# Patient Record
Sex: Male | Born: 1990 | Race: Black or African American | Hispanic: No | Marital: Single | Smoking: Current every day smoker
Health system: Southern US, Community
[De-identification: ages and names within clinical notes are randomized; demographics above are authoritative.]

---
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
## 2018-09-29 ENCOUNTER — Encounter (HOSPITAL_COMMUNITY): Payer: Self-pay

## 2018-09-29 ENCOUNTER — Emergency Department (HOSPITAL_COMMUNITY): Payer: No Typology Code available for payment source

## 2018-09-29 ENCOUNTER — Emergency Department (HOSPITAL_COMMUNITY)
Admission: EM | Admit: 2018-09-29 | Discharge: 2018-09-29 | Disposition: A | Discharge status: Home / Self Care | Payer: No Typology Code available for payment source | Attending: Emergency Medicine | Admitting: Emergency Medicine

## 2018-09-29 ENCOUNTER — Other Ambulatory Visit: Payer: Self-pay

## 2018-09-29 DIAGNOSIS — F17210 Nicotine dependence, cigarettes, uncomplicated: Secondary | ICD-10-CM | POA: Diagnosis not present

## 2018-09-29 DIAGNOSIS — Y92410 Unspecified street and highway as the place of occurrence of the external cause: Secondary | ICD-10-CM | POA: Insufficient documentation

## 2018-09-29 DIAGNOSIS — W2210XA Striking against or struck by unspecified automobile airbag, initial encounter: Secondary | ICD-10-CM | POA: Diagnosis not present

## 2018-09-29 DIAGNOSIS — Y9389 Activity, other specified: Secondary | ICD-10-CM | POA: Insufficient documentation

## 2018-09-29 DIAGNOSIS — Y998 Other external cause status: Secondary | ICD-10-CM | POA: Diagnosis not present

## 2018-09-29 DIAGNOSIS — F1210 Cannabis abuse, uncomplicated: Secondary | ICD-10-CM | POA: Insufficient documentation

## 2018-09-29 DIAGNOSIS — S22008A Other fracture of unspecified thoracic vertebra, initial encounter for closed fracture: Secondary | ICD-10-CM

## 2018-09-29 DIAGNOSIS — Z23 Encounter for immunization: Secondary | ICD-10-CM | POA: Diagnosis not present

## 2018-09-29 DIAGNOSIS — S299XXA Unspecified injury of thorax, initial encounter: Secondary | ICD-10-CM | POA: Diagnosis present

## 2018-09-29 LAB — URINALYSIS, ROUTINE W REFLEX MICROSCOPIC
Bilirubin Urine: NEGATIVE
Glucose, UA: NEGATIVE mg/dL
Hgb urine dipstick: NEGATIVE
Ketones, ur: NEGATIVE mg/dL
Leukocytes,Ua: NEGATIVE
Nitrite: NEGATIVE
Protein, ur: NEGATIVE mg/dL
Specific Gravity, Urine: 1.004 — ABNORMAL LOW (ref 1.005–1.030)
pH: 6 (ref 5.0–8.0)

## 2018-09-29 LAB — COMPREHENSIVE METABOLIC PANEL
ALT: 153 U/L — ABNORMAL HIGH (ref 0–44)
AST: 81 U/L — ABNORMAL HIGH (ref 15–41)
Albumin: 4.7 g/dL (ref 3.5–5.0)
Alkaline Phosphatase: 51 U/L (ref 38–126)
Anion gap: 12 (ref 5–15)
BUN: 8 mg/dL (ref 6–20)
CO2: 22 mmol/L (ref 22–32)
Calcium: 9.3 mg/dL (ref 8.9–10.3)
Chloride: 106 mmol/L (ref 98–111)
Creatinine, Ser: 0.88 mg/dL (ref 0.61–1.24)
GFR calc Af Amer: >60 mL/min (ref >60)
GFR calc non Af Amer: >60 mL/min (ref >60)
Glucose, Bld: 111 mg/dL — ABNORMAL HIGH (ref 70–99)
Potassium: 4 mmol/L (ref 3.5–5.1)
Sodium: 140 mmol/L (ref 135–145)
Total Bilirubin: 1.1 mg/dL (ref 0.3–1.2)
Total Protein: 7.4 g/dL (ref 6.5–8.1)

## 2018-09-29 LAB — CBC
HCT: 48.7 % (ref 39.0–52.0)
Hemoglobin: 15.7 g/dL (ref 13.0–17.0)
MCH: 27.6 pg (ref 26.0–34.0)
MCHC: 32.2 g/dL (ref 30.0–36.0)
MCV: 85.7 fL (ref 80.0–100.0)
Platelets: 294 10*3/uL (ref 150–400)
RBC: 5.68 MIL/uL (ref 4.22–5.81)
RDW: 13.1 % (ref 11.5–15.5)
WBC: 7.4 10*3/uL (ref 4.0–10.5)
nRBC: 0 % (ref 0.0–0.2)

## 2018-09-29 LAB — PROTIME-INR
INR: 0.9 (ref 0.8–1.2)
Prothrombin Time: 12 seconds (ref 11.4–15.2)

## 2018-09-29 LAB — ETHANOL: Alcohol, Ethyl (B): 244 mg/dL — ABNORMAL HIGH (ref <10)

## 2018-09-29 LAB — LACTIC ACID, PLASMA: Lactic Acid, Venous: 1.8 mmol/L (ref 0.5–1.9)

## 2018-09-29 MED ORDER — IOHEXOL 300 MG/ML  SOLN
100.0000 mL | Freq: Once | INTRAMUSCULAR | Status: AC | PRN
  Administered 2018-09-29: 03:00:00 100 mL via INTRAVENOUS

## 2018-09-29 MED ORDER — TETANUS-DIPHTH-ACELL PERTUSSIS 5-2.5-18.5 LF-MCG/0.5 IM SUSP
0.5000 mL | Freq: Once | INTRAMUSCULAR | Status: AC
  Administered 2018-09-29: 05:00:00 0.5 mL via INTRAMUSCULAR
  Filled 2018-09-29: qty 0.5

## 2018-09-29 MED ORDER — HYDROCODONE-ACETAMINOPHEN 5-325 MG PO TABS: 1.0000 | ORAL_TABLET | Freq: Four times a day (QID) | ORAL | 0 refills | Status: AC | PRN

## 2018-09-29 NOTE — ED Notes (Signed)
Pt ambulated in hall with no difficulty  ? ?
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
## 2018-09-29 NOTE — ED Notes (Signed)
Patient aware of discharge disposition, attempting to call someone for a ride at this time
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
## 2018-09-29 NOTE — ED Notes (Signed)
Patient verbalized understanding of dc instructions, vss, ambulatory with nad.   
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
